# Patient Record
Sex: Male | Born: 1960 | Race: Black or African American | Hispanic: No | Marital: Single | Smoking: Never smoker
Health system: Southern US, Community
[De-identification: ages and names within clinical notes are randomized; demographics above are authoritative.]

## PROBLEM LIST (undated history)

## (undated) DIAGNOSIS — I10 Essential (primary) hypertension: Secondary | ICD-10-CM

## (undated) DIAGNOSIS — I2510 Atherosclerotic heart disease of native coronary artery without angina pectoris: Secondary | ICD-10-CM

---

## 2010-05-14 ENCOUNTER — Emergency Department (HOSPITAL_BASED_OUTPATIENT_CLINIC_OR_DEPARTMENT_OTHER): Admission: EM | Admit: 2010-05-14 | Discharge: 2010-05-14 | Payer: Self-pay | Admitting: Emergency Medicine

## 2016-08-03 ENCOUNTER — Emergency Department (HOSPITAL_COMMUNITY): Payer: Medicaid - Out of State

## 2016-08-03 ENCOUNTER — Encounter (HOSPITAL_COMMUNITY): Payer: Self-pay

## 2016-08-03 ENCOUNTER — Inpatient Hospital Stay (HOSPITAL_COMMUNITY)
Admission: EM | Admit: 2016-08-03 | Discharge: 2016-08-05 | DRG: 638 | Disposition: A | Discharge status: Home / Self Care | Payer: Medicaid - Out of State | Attending: Internal Medicine | Admitting: Internal Medicine

## 2016-08-03 DIAGNOSIS — E873 Alkalosis: Secondary | ICD-10-CM | POA: Diagnosis present

## 2016-08-03 DIAGNOSIS — R739 Hyperglycemia, unspecified: Secondary | ICD-10-CM | POA: Diagnosis not present

## 2016-08-03 DIAGNOSIS — Z888 Allergy status to other drugs, medicaments and biological substances status: Secondary | ICD-10-CM

## 2016-08-03 DIAGNOSIS — E119 Type 2 diabetes mellitus without complications: Secondary | ICD-10-CM

## 2016-08-03 DIAGNOSIS — Z87891 Personal history of nicotine dependence: Secondary | ICD-10-CM

## 2016-08-03 DIAGNOSIS — Z7982 Long term (current) use of aspirin: Secondary | ICD-10-CM

## 2016-08-03 DIAGNOSIS — I2510 Atherosclerotic heart disease of native coronary artery without angina pectoris: Secondary | ICD-10-CM | POA: Diagnosis not present

## 2016-08-03 DIAGNOSIS — Z79899 Other long term (current) drug therapy: Secondary | ICD-10-CM

## 2016-08-03 DIAGNOSIS — F17211 Nicotine dependence, cigarettes, in remission: Secondary | ICD-10-CM

## 2016-08-03 DIAGNOSIS — N179 Acute kidney failure, unspecified: Secondary | ICD-10-CM

## 2016-08-03 DIAGNOSIS — E860 Dehydration: Secondary | ICD-10-CM | POA: Diagnosis present

## 2016-08-03 DIAGNOSIS — Z95810 Presence of automatic (implantable) cardiac defibrillator: Secondary | ICD-10-CM

## 2016-08-03 DIAGNOSIS — E1110 Type 2 diabetes mellitus with ketoacidosis without coma: Principal | ICD-10-CM | POA: Diagnosis present

## 2016-08-03 DIAGNOSIS — I255 Ischemic cardiomyopathy: Secondary | ICD-10-CM | POA: Diagnosis not present

## 2016-08-03 DIAGNOSIS — Z955 Presence of coronary angioplasty implant and graft: Secondary | ICD-10-CM

## 2016-08-03 DIAGNOSIS — Z7902 Long term (current) use of antithrombotics/antiplatelets: Secondary | ICD-10-CM

## 2016-08-03 DIAGNOSIS — I10 Essential (primary) hypertension: Secondary | ICD-10-CM | POA: Diagnosis present

## 2016-08-03 HISTORY — DX: Essential (primary) hypertension: I10

## 2016-08-03 HISTORY — DX: Atherosclerotic heart disease of native coronary artery without angina pectoris: I25.10

## 2016-08-03 LAB — CBC
HCT: 45.6 % (ref 39.0–52.0)
Hemoglobin: 16.6 g/dL (ref 13.0–17.0)
MCH: 30.1 pg (ref 26.0–34.0)
MCHC: 36.4 g/dL — ABNORMAL HIGH (ref 30.0–36.0)
MCV: 82.8 fL (ref 78.0–100.0)
Platelets: 273 10*3/uL (ref 150–400)
RBC: 5.51 MIL/uL (ref 4.22–5.81)
RDW: 13.1 % (ref 11.5–15.5)
WBC: 9.6 10*3/uL (ref 4.0–10.5)

## 2016-08-03 LAB — BASIC METABOLIC PANEL
Anion gap: 17 — ABNORMAL HIGH (ref 5–15)
Anion gap: 11 (ref 5–15)
Anion gap: 11 (ref 5–15)
Anion gap: 8 (ref 5–15)
BUN: 30 mg/dL — ABNORMAL HIGH (ref 6–20)
BUN: 25 mg/dL — ABNORMAL HIGH (ref 6–20)
BUN: 23 mg/dL — AB (ref 6–20)
BUN: 20 mg/dL (ref 6–20)
CHLORIDE: 94 mmol/L — AB (ref 101–111)
CHLORIDE: 96 mmol/L — AB (ref 101–111)
CHLORIDE: 98 mmol/L — AB (ref 101–111)
CO2: 22 mmol/L (ref 22–32)
CO2: 25 mmol/L (ref 22–32)
CO2: 26 mmol/L (ref 22–32)
CO2: 27 mmol/L (ref 22–32)
CREATININE: 1.55 mg/dL — AB (ref 0.61–1.24)
CREATININE: 1.51 mg/dL — AB (ref 0.61–1.24)
CREATININE: 1.35 mg/dL — AB (ref 0.61–1.24)
Calcium: 10.3 mg/dL (ref 8.9–10.3)
Calcium: 9.8 mg/dL (ref 8.9–10.3)
Calcium: 9.7 mg/dL (ref 8.9–10.3)
Calcium: 9.6 mg/dL (ref 8.9–10.3)
Chloride: 82 mmol/L — ABNORMAL LOW (ref 101–111)
Creatinine, Ser: 1.73 mg/dL — ABNORMAL HIGH (ref 0.61–1.24)
GFR calc Af Amer: 49 mL/min — ABNORMAL LOW (ref >60)
GFR calc Af Amer: 56 mL/min — ABNORMAL LOW (ref >60)
GFR calc Af Amer: 58 mL/min — ABNORMAL LOW (ref >60)
GFR calc non Af Amer: 42 mL/min — ABNORMAL LOW (ref >60)
GFR calc non Af Amer: 48 mL/min — ABNORMAL LOW (ref >60)
GFR calc non Af Amer: 50 mL/min — ABNORMAL LOW (ref >60)
GFR calc non Af Amer: 57 mL/min — ABNORMAL LOW (ref >60)
GLUCOSE: 574 mg/dL — AB (ref 65–99)
Glucose, Bld: 1013 mg/dL (ref 65–99)
Glucose, Bld: 477 mg/dL — ABNORMAL HIGH (ref 65–99)
Glucose, Bld: 243 mg/dL — ABNORMAL HIGH (ref 65–99)
Potassium: 5.1 mmol/L (ref 3.5–5.1)
Potassium: 4.8 mmol/L (ref 3.5–5.1)
Potassium: 4.5 mmol/L (ref 3.5–5.1)
Potassium: 3.7 mmol/L (ref 3.5–5.1)
Sodium: 121 mmol/L — ABNORMAL LOW (ref 135–145)
Sodium: 130 mmol/L — ABNORMAL LOW (ref 135–145)
Sodium: 133 mmol/L — ABNORMAL LOW (ref 135–145)
Sodium: 133 mmol/L — ABNORMAL LOW (ref 135–145)

## 2016-08-03 LAB — I-STAT VENOUS BLOOD GAS, ED
Acid-Base Excess: 1 mmol/L (ref 0.0–2.0)
Bicarbonate: 25.9 mmol/L (ref 20.0–28.0)
O2 Saturation: 89 %
PH VEN: 7.424 (ref 7.250–7.430)
TCO2: 27 mmol/L (ref 0–100)
pCO2, Ven: 39.6 mmHg — ABNORMAL LOW (ref 44.0–60.0)
pO2, Ven: 54 mmHg — ABNORMAL HIGH (ref 32.0–45.0)

## 2016-08-03 LAB — URINALYSIS, ROUTINE W REFLEX MICROSCOPIC
Bacteria, UA: NONE SEEN
Bilirubin Urine: NEGATIVE
Glucose, UA: >=500 mg/dL — AB
Hgb urine dipstick: NEGATIVE
Ketones, ur: 20 mg/dL — AB
Leukocytes, UA: NEGATIVE
Nitrite: NEGATIVE
Protein, ur: NEGATIVE mg/dL
Specific Gravity, Urine: 1.025 (ref 1.005–1.030)
Squamous Epithelial / LPF: NONE SEEN
pH: 6 (ref 5.0–8.0)

## 2016-08-03 LAB — GLUCOSE, CAPILLARY
GLUCOSE-CAPILLARY: 499 mg/dL — AB (ref 65–99)
GLUCOSE-CAPILLARY: 420 mg/dL — AB (ref 65–99)
GLUCOSE-CAPILLARY: 262 mg/dL — AB (ref 65–99)
GLUCOSE-CAPILLARY: 156 mg/dL — AB (ref 65–99)
Glucose-Capillary: 501 mg/dL (ref 65–99)
Glucose-Capillary: 348 mg/dL — ABNORMAL HIGH (ref 65–99)
Glucose-Capillary: 304 mg/dL — ABNORMAL HIGH (ref 65–99)

## 2016-08-03 LAB — CBG MONITORING, ED: GLUCOSE-CAPILLARY: 514 mg/dL — AB (ref 65–99)

## 2016-08-03 LAB — MRSA PCR SCREENING: MRSA by PCR: NEGATIVE

## 2016-08-03 LAB — MAGNESIUM
Magnesium: 2.2 mg/dL (ref 1.7–2.4)
Magnesium: 2.2 mg/dL (ref 1.7–2.4)

## 2016-08-03 LAB — TROPONIN I
TROPONIN I: 0.04 ng/mL — AB (ref <0.03)
Troponin I: 0.04 ng/mL (ref <0.03)
Troponin I: 0.03 ng/mL (ref <0.03)

## 2016-08-03 MED ORDER — DEXTROSE-NACL 5-0.45 % IV SOLN: INTRAVENOUS | Status: DC

## 2016-08-03 MED ORDER — SODIUM CHLORIDE 0.9 % IV BOLUS (SEPSIS)
1000.0000 mL | Freq: Once | INTRAVENOUS | Status: AC
  Administered 2016-08-03: 1000 mL via INTRAVENOUS

## 2016-08-03 MED ORDER — ENOXAPARIN SODIUM 40 MG/0.4ML ~~LOC~~ SOLN
40.0000 mg | SUBCUTANEOUS | Status: DC
  Administered 2016-08-03 – 2016-08-04 (×2): 40 mg via SUBCUTANEOUS
  Filled 2016-08-03 (×3): qty 0.4

## 2016-08-03 MED ORDER — ASPIRIN EC 81 MG PO TBEC
81.0000 mg | DELAYED_RELEASE_TABLET | Freq: Every day | ORAL | Status: DC
  Administered 2016-08-03 – 2016-08-05 (×3): 81 mg via ORAL
  Filled 2016-08-03 (×3): qty 1

## 2016-08-03 MED ORDER — SODIUM CHLORIDE 0.9 % IV SOLN
INTRAVENOUS | Status: DC
  Administered 2016-08-03: 14:00:00 via INTRAVENOUS

## 2016-08-03 MED ORDER — SODIUM CHLORIDE 0.9% FLUSH
3.0000 mL | Freq: Two times a day (BID) | INTRAVENOUS | Status: DC
  Administered 2016-08-04 – 2016-08-05 (×2): 3 mL via INTRAVENOUS

## 2016-08-03 MED ORDER — INSULIN GLARGINE 100 UNIT/ML ~~LOC~~ SOLN
20.0000 [IU] | Freq: Every day | SUBCUTANEOUS | Status: DC
  Administered 2016-08-03 – 2016-08-04 (×2): 20 [IU] via SUBCUTANEOUS
  Filled 2016-08-03 (×2): qty 0.2

## 2016-08-03 MED ORDER — INSULIN ASPART 100 UNIT/ML ~~LOC~~ SOLN
11.0000 [IU] | Freq: Once | SUBCUTANEOUS | Status: AC
  Administered 2016-08-03: 11 [IU] via INTRAVENOUS
  Filled 2016-08-03: qty 1

## 2016-08-03 MED ORDER — ROSUVASTATIN CALCIUM 40 MG PO TABS
40.0000 mg | ORAL_TABLET | Freq: Every day | ORAL | Status: DC
  Administered 2016-08-03 – 2016-08-05 (×3): 40 mg via ORAL
  Filled 2016-08-03 (×3): qty 1

## 2016-08-03 MED ORDER — ADULT MULTIVITAMIN W/MINERALS CH
1.0000 | ORAL_TABLET | Freq: Every day | ORAL | Status: DC
  Administered 2016-08-03 – 2016-08-05 (×3): 1 via ORAL
  Filled 2016-08-03 (×3): qty 1

## 2016-08-03 MED ORDER — TICAGRELOR 90 MG PO TABS
90.0000 mg | ORAL_TABLET | Freq: Two times a day (BID) | ORAL | Status: DC
  Administered 2016-08-03 – 2016-08-05 (×4): 90 mg via ORAL
  Filled 2016-08-03 (×4): qty 1

## 2016-08-03 MED ORDER — CARVEDILOL 6.25 MG PO TABS
6.2500 mg | ORAL_TABLET | Freq: Every day | ORAL | Status: DC
  Administered 2016-08-03 – 2016-08-05 (×3): 6.25 mg via ORAL
  Filled 2016-08-03 (×3): qty 1

## 2016-08-03 MED ORDER — SODIUM CHLORIDE 0.9 % IV BOLUS (SEPSIS): 1000.0000 mL | Freq: Once | INTRAVENOUS | Status: DC

## 2016-08-03 MED ORDER — INSULIN REGULAR HUMAN 100 UNIT/ML IJ SOLN
INTRAMUSCULAR | Status: DC
  Administered 2016-08-03: 4.4 [IU]/h via INTRAVENOUS
  Filled 2016-08-03 (×2): qty 2.5

## 2016-08-03 NOTE — ED Notes (Signed)
Family at bedside. 

## 2016-08-03 NOTE — ED Notes (Signed)
Patient transported to X-ray 

## 2016-08-03 NOTE — ED Notes (Addendum)
Pt returns from radiology. Placed back on tele. 

## 2016-08-03 NOTE — ED Notes (Signed)
Attempted to call report

## 2016-08-03 NOTE — ED Provider Notes (Addendum)
[facility]-EMERGENCY DEPT Provider Note   CSN: [id number] Arrival date & time: 08/03/16  4098     History   Chief Complaint Chief Complaint  Patient presents with  . Weakness/hyperglycemia    HPI Paul Avila is a 55 y.o. male.  HPI 43 old male with history of hypertension and coronary disease here with a several week history of progressively worsening polyuria, polydipsia, and general fatigue. The patient states that over the last 2 weeks, he has noticed progressively worsening thirst and seems to be urinating more frequently. He feels that he is emptying completely but has to go more often. He is waking up multiple times throughout the night. He also endorses generalized weight loss, fatigue, and decreased appetite. He thought he had an infection so decided to present to the ED. Denies any fevers. Denies any cough. No shortness of breath. He has no history of diabetes and has never taken medications for this. He has not recently been evaluated by a physician.  Past Medical History:  Diagnosis Date  . Coronary artery disease   . Hypertension     Patient Active Problem List   Diagnosis Date Noted  . Hyperglycemia 08/03/2016  . CAD (coronary artery disease) 08/03/2016  . HTN (hypertension) 08/03/2016    History reviewed. No pertinent surgical history.     Home Medications    Prior to Admission medications   Medication Sig Start Date End Date Taking? Authorizing Provider  aspirin EC 81 MG tablet Take 81 mg by mouth daily.   Yes Historical Provider, MD  carvedilol (COREG) 6.25 MG tablet Take 6.25 mg by mouth daily at 12 noon.   Yes Historical Provider, MD  furosemide (LASIX) 40 MG tablet Take 40 mg by mouth daily.   Yes Historical Provider, MD  losartan (COZAAR) 25 MG tablet Take 25 mg by mouth daily at 12 noon.   Yes Historical Provider, MD  Multiple Vitamin (MULTIVITAMIN WITH MINERALS) TABS tablet Take 1 tablet by mouth daily.   Yes Historical Provider, MD  rosuvastatin  (CRESTOR) 40 MG tablet Take 40 mg by mouth daily at 12 noon.   Yes Historical Provider, MD  ticagrelor (BRILINTA) 90 MG TABS tablet Take 90 mg by mouth 2 (two) times daily.   Yes Historical Provider, MD    Family History No family history on file.  Social History Social History  Substance Use Topics  . Smoking status: Never Smoker  . Smokeless tobacco: Never Used  . Alcohol use Not on file     Allergies   Glucophage [metformin] and Januvia [sitagliptin]   Review of Systems Review of Systems  Constitutional: Positive for fatigue and unexpected weight change. Negative for chills and fever.  HENT: Negative for congestion and rhinorrhea.   Eyes: Negative for visual disturbance.  Respiratory: Negative for cough, shortness of breath and wheezing.   Cardiovascular: Negative for chest pain and leg swelling.  Gastrointestinal: Negative for abdominal pain, diarrhea, nausea and vomiting.  Endocrine: Positive for polydipsia, polyphagia and polyuria.  Genitourinary: Negative for dysuria and flank pain.  Musculoskeletal: Negative for neck pain and neck stiffness.  Skin: Negative for rash and wound.  Allergic/Immunologic: Negative for immunocompromised state.  Neurological: Negative for syncope, weakness and headaches.  All other systems reviewed and are negative.    Physical Exam Updated Vital Signs BP 116/94   Pulse 80   Temp 98 F (36.7 C) (Oral)   Resp 18   Ht 5\' 9"  (1.753 m)   Wt 245 lb (111.1 kg)  SpO2 99%   BMI 36.18 kg/m   Physical Exam  Constitutional: He appears well-developed and well-nourished. No distress.  HENT:  Head: Normocephalic.  Dry mucous membranes  Eyes: Conjunctivae are normal. Pupils are equal, round, and reactive to light.  Neck: Neck supple.  Cardiovascular: Normal rate, regular rhythm and normal heart sounds.   No murmur heard. Pulmonary/Chest: Effort normal. No respiratory distress. He has no wheezes. He has no rales.  Abdominal: Soft. Bowel  sounds are normal. He exhibits no distension. There is no tenderness.  Musculoskeletal: He exhibits no edema.  Neurological: He is alert.  Skin: Skin is warm. Capillary refill takes 2 to 3 seconds.  Nursing note and vitals reviewed.    ED Treatments / Results  Labs (all labs ordered are listed, but only abnormal results are displayed) Labs Reviewed  BASIC METABOLIC PANEL - Abnormal; Notable for the following:       Result Value   Sodium 121 (*)    Chloride 82 (*)    Glucose, Bld 1,013 (*)    BUN 30 (*)    Creatinine, Ser 1.73 (*)    GFR calc non Af Amer 42 (*)    GFR calc Af Amer 49 (*)    Anion gap 17 (*)    All other components within normal limits  CBC - Abnormal; Notable for the following:    MCHC 36.4 (*)    All other components within normal limits  URINALYSIS, ROUTINE W REFLEX MICROSCOPIC - Abnormal; Notable for the following:    Color, Urine STRAW (*)    Glucose, UA >=500 (*)    Ketones, ur 20 (*)    All other components within normal limits  TROPONIN I - Abnormal; Notable for the following:    Troponin I 0.04 (*)    All other components within normal limits  BASIC METABOLIC PANEL - Abnormal; Notable for the following:    Sodium 130 (*)    Chloride 94 (*)    Glucose, Bld 574 (*)    BUN 25 (*)    Creatinine, Ser 1.55 (*)    GFR calc non Af Amer 48 (*)    GFR calc Af Amer 56 (*)    All other components within normal limits  TROPONIN I - Abnormal; Notable for the following:    Troponin I 0.04 (*)    All other components within normal limits  CBG MONITORING, ED - Abnormal; Notable for the following:    Glucose-Capillary >600 (*)    All other components within normal limits  I-STAT VENOUS BLOOD GAS, ED - Abnormal; Notable for the following:    pCO2, Ven 39.6 (*)    pO2, Ven 54.0 (*)    All other components within normal limits  CBG MONITORING, ED - Abnormal; Notable for the following:    Glucose-Capillary 514 (*)    All other components within normal limits    MAGNESIUM  BASIC METABOLIC PANEL  BASIC METABOLIC PANEL  BASIC METABOLIC PANEL  TROPONIN I  TROPONIN I    EKG  EKG Interpretation  Date/Time:  Saturday August 03 2016 10:22:59 EST Ventricular Rate:  90 PR Interval:    QRS Duration: 91 QT Interval:  375 QTC Calculation: 459 R Axis:   66 Text Interpretation:  Sinus rhythm Probable left atrial enlargement Anterior infarct, old Baseline wander in lead(s) V5 No old tracing to compare No ST elevations or depressions Confirmed by Erma HeritageISAACS MD, Laurabeth Yip [id number]) on 08/03/2016 11:08:30 AM  Radiology Dg Chest 2 View  Result Date: 08/03/2016 CLINICAL DATA:  Cough. EXAM: CHEST  2 VIEW COMPARISON:  None. 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 No pneumothorax. The heart, hila, mediastinum, lungs, and pleura are normal. Degenerative changes in the thoracic spine. No other abnormalities. IMPRESSION: No active cardiopulmonary disease. Electronically Signed   By: Gerome Samavid  Williams III M.D   On: 08/03/2016 10:54    Procedures Procedures (including critical care time)  Medications Ordered in ED Medications  0.9 %  sodium chloride infusion (not administered)  dextrose 5 %-0.45 % sodium chloride infusion (not administered)  insulin regular (NOVOLIN R,HUMULIN R) 250 Units in sodium chloride 0.9 % 250 mL (1 Units/mL) infusion (not administered)  sodium chloride 0.9 % bolus 1,000 mL (not administered)  sodium chloride 0.9 % bolus 1,000 mL (0 mLs Intravenous Stopped 08/03/16 1202)  sodium chloride 0.9 % bolus 1,000 mL (0 mLs Intravenous Stopped 08/03/16 1313)  insulin aspart (novoLOG) injection 11 Units (11 Units Intravenous Given 08/03/16 1157)     Initial Impression / Assessment and Plan / ED Course  I have reviewed the triage vital signs and the nursing notes.  Pertinent labs & imaging results that were available during my care of the patient were reviewed by me and considered in my medical decision making (see chart for  details).  Clinical Course     [age] old male who presents with polyuria, polydipsia, and polyphagia. On arrival, vital signs are stable. He is markedly dehydrated on exam. Labwork shows glucose of 1013 with AK I. Anion gap is only 17 and her only 20 ketones in his urine with normal pH and bicarbonate. I suspect his mild elevation of anion gap is due to dehydration and he shows no evidence of DKA. Will give IV fluids, IV insulin, and admit to medicine for new onset, symptomatic, severe type 2 diabetes with hyperglycemia.  Final Clinical Impressions(s) / ED Diagnoses   Final diagnoses:  Hyperglycemia  New onset type 2 diabetes mellitus [facility](HCC)    New Prescriptions Current Discharge Medication List       Shaune Pollackameron Collyn Selk, MD 08/03/16 1537    Shaune Pollackameron Kevis Qu, MD 08/14/16 2050

## 2016-08-03 NOTE — H&P (Signed)
Date: 08/03/2016               Patient Name:  Paul Avila MRN: [id number]  DOB: 06-Mar-1961 Age / Sex: 55 y.o., male   PCP: No primary care provider on file.         Medical Service: Internal Medicine Teaching Service         Attending Physician: Dr. Earl Lagos    First Contact: Dr. Carolynn Comment Pager: [phone]  Second Contact: Dr. Reggie Pile Pager: [phone]       After Hours (After 5p/  First Contact Pager: [phone]  weekends / holidays): Second Contact Pager: [phone]   Chief Complaint: Polyuria, polydipsia, fatigue  History of Present Illness: Mr. Paul Avila is a 55 y.o. male with a h/o of CAD s/p AICD and HTN who presents with several week h/o progressive fatigue, general malaise, polyuria, and polydipsia.  Pt reports that he began to feel unwell 3 weeks ago w/ worsening of fatigue, thirst, and increased frequency of urination. He has not h/o DM. He thought that he might have an infection and presented to the ED for evaluation. He reports that he has been drinking everything he can get his hands on including lots of soda and juice at home. He also notes that he has had difficulty sleeping 2/2 frequent waking for urination. He reports no trouble with voiding completely, but reports increased frequency. He also complains of very dry mouth. He endorses some blurring of vision when his symptoms began.  He denies any fever, chills, SOB, CP, myalgias, HA, or changes to mental status.  In the ED he was found to have BG >1000 w/o evidence of acidosis or changes to MS. He was given 2L NS bolus and 11U Aspart Insulin.  Meds: Current Facility-Administered Medications  Medication Dose Route Frequency Provider Last Rate Last Dose  . 0.9 %  sodium chloride infusion   Intravenous Continuous John Giovanni, MD      . dextrose 5 %-0.45 % sodium chloride infusion   Intravenous Continuous John Giovanni, MD      . insulin regular (NOVOLIN R,HUMULIN R) 250 Units in sodium  chloride 0.9 % 250 mL (1 Units/mL) infusion   Intravenous Continuous John Giovanni, MD      . sodium chloride 0.9 % bolus 1,000 mL  1,000 mL Intravenous Once John Giovanni, MD       Current Outpatient Prescriptions  Medication Sig Dispense Refill  . aspirin EC 81 MG tablet Take 81 mg by mouth daily.    . carvedilol (COREG) 6.25 MG tablet Take 6.25 mg by mouth daily at 12 noon.    . furosemide (LASIX) 40 MG tablet Take 40 mg by mouth daily.    Marland Kitchen losartan (COZAAR) 25 MG tablet Take 25 mg by mouth daily at 12 noon.    . Multiple Vitamin (MULTIVITAMIN WITH MINERALS) TABS tablet Take 1 tablet by mouth daily.    . rosuvastatin (CRESTOR) 40 MG tablet Take 40 mg by mouth daily at 12 noon.    . ticagrelor (BRILINTA) 90 MG TABS tablet Take 90 mg by mouth 2 (two) times daily.      (Not in a hospital admission) Allergies: Allergies as of 08/03/2016 - Review Complete 08/03/2016  Allergen Reaction Noted  . Glucophage [metformin] Hives 08/03/2016  . Januvia [sitagliptin]  08/03/2016   Past Medical History:  Diagnosis Date  . Coronary artery disease   . Hypertension    Family History: Pt denies family  hx of DM.  Social History: Pt has long smoking hx, w/ 2 ppd for many years, reportedly quit in Sept. Reports drinking liquor every other week. Denies recreational drugs. Lives in [place], in [place].  Review of Systems: A complete ROS was negative except as per HPI. Review of Systems  Constitutional: Positive for malaise/fatigue. Negative for chills, diaphoresis, fever and weight loss.  Eyes: Positive for blurred vision. Negative for double vision.  Respiratory: Negative for cough and shortness of breath.   Cardiovascular: Negative for chest pain and leg swelling.  Gastrointestinal: Negative for abdominal pain, constipation, diarrhea, nausea and vomiting.  Genitourinary: Positive for frequency. Negative for dysuria, hematuria and urgency.  Musculoskeletal: Negative for myalgias.    Skin: Negative for rash.  Neurological: Negative for dizziness, tremors, focal weakness, weakness and headaches.  Endo/Heme/Allergies: Positive for polydipsia.  Psychiatric/Behavioral: The patient is not nervous/anxious.    Physical Exam: Vitals:   08/03/16 1145 08/03/16 1215 08/03/16 1245 08/03/16 1300  BP: 125/92 130/82 131/89 122/87  Pulse: 84 83 95 80  Resp: 22 18 17 25   Temp:      TempSrc:      SpO2: 99% 99% 100% 100%  Weight:      Height:       Physical Exam  Constitutional: He is oriented to person, place, and time. He appears well-developed and well-nourished. He is cooperative. No distress.  HENT:  Head: Normocephalic and atraumatic.  Right Ear: Hearing normal.  Left Ear: Hearing normal.  Nose: Nose normal.  Mouth/Throat: Mucous membranes are dry.  Cardiovascular: Normal rate, regular rhythm, S1 normal, S2 normal and intact distal pulses.  Exam reveals no gallop.   No murmur heard. Pulmonary/Chest: Effort normal and breath sounds normal. No respiratory distress. He has no wheezes. He has no rhonchi. He has no rales. He exhibits no tenderness.  Abdominal: Soft. Normal appearance and bowel sounds are normal. He exhibits no ascites. There is no hepatosplenomegaly. There is no tenderness.  Musculoskeletal: He exhibits no edema or tenderness.  Neurological: He is alert and oriented to person, place, and time. He has normal strength.  Skin: Skin is warm, dry and intact. He is not diaphoretic.  Psychiatric: He has a normal mood and affect. His speech is normal and behavior is normal.   Labs: CBC:  Recent Labs Lab 08/03/16 1023  WBC 9.6  HGB 16.6  HCT 45.6  MCV 82.8  PLT 273   Basic Metabolic Panel:  Recent Labs Lab 08/03/16 1023  NA 121*  K 5.1  CL 82*  CO2 22  GLUCOSE 1,013*  BUN 30*  CREATININE 1.73*  CALCIUM 10.3    Recent Labs Lab 08/03/16 1003  GLUCAP >600*   Urinalysis    Component Value Date/Time   COLORURINE STRAW (A) 08/03/2016 1130    APPEARANCEUR CLEAR 08/03/2016 1130   LABSPEC 1.025 08/03/2016 1130   PHURINE 6.0 08/03/2016 1130   GLUCOSEU >=500 (A) 08/03/2016 1130   HGBUR NEGATIVE 08/03/2016 1130   BILIRUBINUR NEGATIVE 08/03/2016 1130   KETONESUR 20 (A) 08/03/2016 1130   PROTEINUR NEGATIVE 08/03/2016 1130   NITRITE NEGATIVE 08/03/2016 1130   LEUKOCYTESUR NEGATIVE 08/03/2016 1130   EKG: EKG Interpretation  Date/Time:  Saturday August 03 2016 10:22:59 EST Ventricular Rate:  90 PR Interval:    QRS Duration: 91 QT Interval:  375 QTC Calculation: 459 R Axis:   66 Text Interpretation:  Sinus rhythm Probable left atrial enlargement Anterior infarct, old Baseline wander in lead(s) V5 No old tracing  to compare No ST elevations or depressions Confirmed by Erma HeritageISAACS MD, CAMERON [phone]([id number]) on 08/03/2016 11:08:30 AM  Imaging: Dg Chest 2 View  Result Date: 08/03/2016 CLINICAL DATA:  Cough. EXAM: CHEST  2 VIEW COMPARISON:  None. 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 No pneumothorax. The heart, hila, mediastinum, lungs, and pleura are normal. Degenerative changes in the thoracic spine. No other abnormalities. IMPRESSION: No active cardiopulmonary disease. Electronically Signed   By: Gerome Samavid  Williams III M.D   On: 08/03/2016 10:54   Assessment & Plan by Problem: Principal Problem:   Hyperglycemia Active Problems:   CAD (coronary artery disease)   HTN (hypertension)  Mr. Paul Avila is a 55 y.o. male with h/o CAD and HTN who presents w/ polyuria, polydipsia found to be 2/2 hyperglycemia.  1) Hyperglycemia: Reports no h/o DM, but records demonstrate h/o metformin and januvia (now listed as allergies). BG >1000 w/ h/o polyuria/polydipsia. No changes to MS, non-acidotic, mild ketonuria, AG 17. Unlikely DKA, not HHS d/t stable MS. - admit to stepdown - insulin ggt, q2h CBG, then SQ Aspart when BG <250 - 1L NS bolus, then 150cc/hr - repeat BMP q4h - check Mg / K and replete PRN - A1c  2) CAD / ischemic  cardiomyopathy: denies CP. AICD visualized on CXR. Non-ischemic EKG. Mild trop bump in ED. On furosemide, losartan, carvedilol, rosuvastatin, ticagrelor at home. Continue home meds except hold lasix and ARB for AKI. - trend Trop  3) HTN: BP stable on presentation. Continue home meds as above.  4) AKI: Scr 1.7 on presentation, records indicate baseline of 1.5. Likely prerenal 2/2 dehydration. Hold lasix and ARB. IVF. Recheck SCr.  DVT PPx - low molecular weight heparin  Code Status - FULL  Dispo: Admit patient for Observation.  Signed: Carolynn CommentBryan Favor Hackler, MD 08/03/2016, 2:15 PM  Pager: [phone]

## 2016-08-03 NOTE — ED Triage Notes (Signed)
Patient complains of 2 weeks of fatigue and weakness. Complains of increased thirst and frequent urination. States no history of diabetes. Lips dry and cracked on arrival. CBG reading critical high a triage. Alert and oriented

## 2016-08-04 DIAGNOSIS — N179 Acute kidney failure, unspecified: Secondary | ICD-10-CM | POA: Diagnosis present

## 2016-08-04 DIAGNOSIS — R739 Hyperglycemia, unspecified: Secondary | ICD-10-CM | POA: Diagnosis not present

## 2016-08-04 DIAGNOSIS — Z79899 Other long term (current) drug therapy: Secondary | ICD-10-CM | POA: Diagnosis not present

## 2016-08-04 DIAGNOSIS — E1165 Type 2 diabetes mellitus with hyperglycemia: Secondary | ICD-10-CM | POA: Diagnosis present

## 2016-08-04 DIAGNOSIS — I10 Essential (primary) hypertension: Secondary | ICD-10-CM | POA: Diagnosis present

## 2016-08-04 DIAGNOSIS — E1110 Type 2 diabetes mellitus with ketoacidosis without coma: Secondary | ICD-10-CM | POA: Diagnosis present

## 2016-08-04 DIAGNOSIS — Z7982 Long term (current) use of aspirin: Secondary | ICD-10-CM | POA: Diagnosis not present

## 2016-08-04 DIAGNOSIS — Z955 Presence of coronary angioplasty implant and graft: Secondary | ICD-10-CM | POA: Diagnosis not present

## 2016-08-04 DIAGNOSIS — E860 Dehydration: Secondary | ICD-10-CM | POA: Diagnosis present

## 2016-08-04 DIAGNOSIS — Z87891 Personal history of nicotine dependence: Secondary | ICD-10-CM | POA: Diagnosis not present

## 2016-08-04 DIAGNOSIS — Z7902 Long term (current) use of antithrombotics/antiplatelets: Secondary | ICD-10-CM | POA: Diagnosis not present

## 2016-08-04 DIAGNOSIS — I255 Ischemic cardiomyopathy: Secondary | ICD-10-CM | POA: Diagnosis present

## 2016-08-04 DIAGNOSIS — Z95810 Presence of automatic (implantable) cardiac defibrillator: Secondary | ICD-10-CM | POA: Diagnosis not present

## 2016-08-04 DIAGNOSIS — E873 Alkalosis: Secondary | ICD-10-CM | POA: Diagnosis present

## 2016-08-04 DIAGNOSIS — Z888 Allergy status to other drugs, medicaments and biological substances status: Secondary | ICD-10-CM | POA: Diagnosis not present

## 2016-08-04 DIAGNOSIS — I2510 Atherosclerotic heart disease of native coronary artery without angina pectoris: Secondary | ICD-10-CM | POA: Diagnosis present

## 2016-08-04 LAB — GLUCOSE, CAPILLARY
GLUCOSE-CAPILLARY: 134 mg/dL — AB (ref 65–99)
GLUCOSE-CAPILLARY: 351 mg/dL — AB (ref 65–99)
GLUCOSE-CAPILLARY: 409 mg/dL — AB (ref 65–99)
GLUCOSE-CAPILLARY: 411 mg/dL — AB (ref 65–99)
GLUCOSE-CAPILLARY: 476 mg/dL — AB (ref 65–99)
Glucose-Capillary: 147 mg/dL — ABNORMAL HIGH (ref 65–99)

## 2016-08-04 LAB — BASIC METABOLIC PANEL
ANION GAP: 10 (ref 5–15)
Anion gap: 8 (ref 5–15)
BUN: 19 mg/dL (ref 6–20)
BUN: 19 mg/dL (ref 6–20)
CALCIUM: 9.4 mg/dL (ref 8.9–10.3)
CHLORIDE: 98 mmol/L — AB (ref 101–111)
CO2: 29 mmol/L (ref 22–32)
CO2: 25 mmol/L (ref 22–32)
CREATININE: 1.46 mg/dL — AB (ref 0.61–1.24)
Calcium: 9.2 mg/dL (ref 8.9–10.3)
Chloride: 98 mmol/L — ABNORMAL LOW (ref 101–111)
Creatinine, Ser: 1.4 mg/dL — ABNORMAL HIGH (ref 0.61–1.24)
GFR calc Af Amer: >60 mL/min (ref >60)
GFR calc non Af Amer: 52 mL/min — ABNORMAL LOW (ref >60)
GFR calc non Af Amer: 55 mL/min — ABNORMAL LOW (ref >60)
GLUCOSE: 265 mg/dL — AB (ref 65–99)
Glucose, Bld: 355 mg/dL — ABNORMAL HIGH (ref 65–99)
POTASSIUM: 4 mmol/L (ref 3.5–5.1)
Potassium: 4 mmol/L (ref 3.5–5.1)
SODIUM: 133 mmol/L — AB (ref 135–145)
Sodium: 135 mmol/L (ref 135–145)

## 2016-08-04 LAB — TROPONIN I: Troponin I: 0.03 ng/mL (ref <0.03)

## 2016-08-04 LAB — HEMOGLOBIN A1C: Mean Plasma Glucose: >398 mg/dL

## 2016-08-04 MED ORDER — INSULIN GLARGINE 100 UNIT/ML ~~LOC~~ SOLN: 25.0000 [IU] | Freq: Every day | SUBCUTANEOUS | Status: DC

## 2016-08-04 MED ORDER — INSULIN GLARGINE 100 UNIT/ML ~~LOC~~ SOLN: 30.0000 [IU] | Freq: Every day | SUBCUTANEOUS | Status: DC

## 2016-08-04 MED ORDER — INSULIN GLARGINE 100 UNIT/ML ~~LOC~~ SOLN: 25.0000 [IU] | Freq: Once | SUBCUTANEOUS | Status: DC

## 2016-08-04 MED ORDER — INSULIN ASPART 100 UNIT/ML ~~LOC~~ SOLN
0.0000 [IU] | Freq: Three times a day (TID) | SUBCUTANEOUS | Status: DC
  Administered 2016-08-05: 15 [IU] via SUBCUTANEOUS

## 2016-08-04 MED ORDER — GLIPIZIDE 5 MG PO TABS: 5.0000 mg | ORAL_TABLET | Freq: Every day | ORAL | 3 refills | Status: DC

## 2016-08-04 MED ORDER — INSULIN GLARGINE 100 UNIT/ML ~~LOC~~ SOLN
30.0000 [IU] | Freq: Every day | SUBCUTANEOUS | Status: DC
  Administered 2016-08-05: 30 [IU] via SUBCUTANEOUS
  Filled 2016-08-04: qty 0.3

## 2016-08-04 MED ORDER — INSULIN ASPART 100 UNIT/ML ~~LOC~~ SOLN: 0.0000 [IU] | Freq: Every day | SUBCUTANEOUS | Status: DC

## 2016-08-04 MED ORDER — INSULIN ASPART 100 UNIT/ML ~~LOC~~ SOLN
0.0000 [IU] | Freq: Three times a day (TID) | SUBCUTANEOUS | Status: DC
  Administered 2016-08-04 (×2): 10 [IU] via SUBCUTANEOUS

## 2016-08-04 MED ORDER — INSULIN ASPART 100 UNIT/ML ~~LOC~~ SOLN
0.0000 [IU] | Freq: Every day | SUBCUTANEOUS | Status: DC
  Administered 2016-08-04: 5 [IU] via SUBCUTANEOUS

## 2016-08-04 MED ORDER — INSULIN ASPART 100 UNIT/ML ~~LOC~~ SOLN
0.0000 [IU] | Freq: Three times a day (TID) | SUBCUTANEOUS | Status: DC
  Administered 2016-08-04: 15 [IU] via SUBCUTANEOUS

## 2016-08-04 MED ORDER — INSULIN ASPART 100 UNIT/ML ~~LOC~~ SOLN: 0.0000 [IU] | SUBCUTANEOUS | Status: DC

## 2016-08-04 NOTE — Progress Notes (Signed)
Patients morning blood sugar was 409, per order MD notified and order received to administer 10 units of novolog. Additionally, patient initially un-accepting of the diagnosis of diabetes and use of insulin at home but upon further communication and education patient has now accepted the diagnosis and is willing to begin insulin upon discharge. Dr. Ladona Ridgelaylor notified

## 2016-08-04 NOTE — Progress Notes (Signed)
   Subjective: No acute events overnight. Patient feels better this morning.  Objective:  Vital signs in last 24 hours: Vitals:   08/03/16 2351 08/04/16 0318 08/04/16 0802 08/04/16 1137  BP: 103/80 96/63 (!) 125/92 124/88  Pulse: 83 70 76 96  Resp: (!) 25 12 13  (!) 22  Temp: 98 F (36.7 C) 98.3 F (36.8 C) 98.9 F (37.2 C) 98.3 F (36.8 C)  TempSrc: Oral Oral Oral Oral  SpO2: 100% 100% 100% 100%  Weight:      Height:       Physical Exam  Constitutional: He is oriented to person, place, and time. He appears well-developed and well-nourished.  HENT:  Head: Normocephalic and atraumatic.  Cardiovascular: Normal rate and regular rhythm.  Exam reveals no friction rub.   No murmur heard. Respiratory: Effort normal and breath sounds normal. No respiratory distress. He has no wheezes.  GI: Soft. Bowel sounds are normal. He exhibits no distension. There is no tenderness.  Musculoskeletal: He exhibits no edema.  Neurological: He is alert and oriented to person, place, and time.     Assessment/Plan:  Principal Problem:   Hyperglycemia Active Problems:   CAD (coronary artery disease)   HTN (hypertension)  1) Hyperglycemia: Reports no h/o DM, but records demonstrate h/o metformin and januvia (now listed as allergies). BG >1000 w/ h/o polyuria/polydipsia. No changes to MS, non-acidotic, mild ketonuria, AG 17. Unlikely DKA, not HHS d/t stable MS. Patient's glucose improved overnight and the insulin drip was discontinued. Discussing with the patient this morning he adamantly denies that he has diabetes. He refuses to take insulin. He does not want start any new medications but has agreed to start one oral medication for his sugar. Again, the patient stated that he does not have diabetes despite what we told him and how high his blood sugars were. He refuses to take insulin. -- We'll start glipizide 5 mg once daily. He will need to make an appointment with community health and wellness while  in [place] and f/u with his pcp when he returns to [place]  2) CAD / ischemic cardiomyopathy: denies CP. AICD visualized on CXR. Non-ischemic EKG. Mild trop bump in ED. On furosemide, losartan, carvedilol, rosuvastatin, ticagrelor at home. Continue home meds except hold lasix and ARB for AKI. No chest pain on rounds this morning. Says he feels great. He is ready to leave and says he is leaving today. - Troponin peaked at 0.04  3) HTN: BP stable on presentation. Continue home meds as above.  4) AKI: Scr 1.7 on presentation, records indicate baseline of 1.5. Likely prerenal 2/2 dehydration. Hold lasix and ARB. IVF. Recheck SCr. Most recently 1.40. Will d/c patient on all his home medications as his Cr is improved.  DVT PPx - low molecular weight heparin  Code Status - FULL  Dispo: Anticipated discharge today.  Thomasene LotJames Hildegarde Dunaway, MD 08/04/2016, 12:44 PM Pager: [phone]

## 2016-08-04 NOTE — Progress Notes (Signed)
Patients noon blood sugar is again elevated above sliding scale range for nurse treatment at 476, Dr. Ladona Ridgelaylor notified and order received to administer 10 units of novolog again. Medications administered and will continue to monitor

## 2016-08-05 DIAGNOSIS — E119 Type 2 diabetes mellitus without complications: Secondary | ICD-10-CM

## 2016-08-05 DIAGNOSIS — Z95810 Presence of automatic (implantable) cardiac defibrillator: Secondary | ICD-10-CM

## 2016-08-05 DIAGNOSIS — I2510 Atherosclerotic heart disease of native coronary artery without angina pectoris: Secondary | ICD-10-CM

## 2016-08-05 DIAGNOSIS — I10 Essential (primary) hypertension: Secondary | ICD-10-CM

## 2016-08-05 DIAGNOSIS — E1110 Type 2 diabetes mellitus with ketoacidosis without coma: Principal | ICD-10-CM

## 2016-08-05 LAB — GLUCOSE, CAPILLARY
GLUCOSE-CAPILLARY: 413 mg/dL — AB (ref 65–99)
Glucose-Capillary: 400 mg/dL — ABNORMAL HIGH (ref 65–99)
Glucose-Capillary: 295 mg/dL — ABNORMAL HIGH (ref 65–99)

## 2016-08-05 LAB — BASIC METABOLIC PANEL
ANION GAP: 10 (ref 5–15)
BUN: 16 mg/dL (ref 6–20)
CHLORIDE: 98 mmol/L — AB (ref 101–111)
CO2: 25 mmol/L (ref 22–32)
CREATININE: 1.35 mg/dL — AB (ref 0.61–1.24)
Calcium: 9.2 mg/dL (ref 8.9–10.3)
GFR calc non Af Amer: 57 mL/min — ABNORMAL LOW (ref >60)
Glucose, Bld: 419 mg/dL — ABNORMAL HIGH (ref 65–99)
POTASSIUM: 3.7 mmol/L (ref 3.5–5.1)
SODIUM: 133 mmol/L — AB (ref 135–145)

## 2016-08-05 MED ORDER — POLYETHYLENE GLYCOL 3350 17 G PO PACK
17.0000 g | PACK | Freq: Every day | ORAL | Status: DC
  Administered 2016-08-05: 17 g via ORAL
  Filled 2016-08-05: qty 1

## 2016-08-05 MED ORDER — "SYRINGE/NEEDLE (DISP) 30G X 1/2"" 1 ML MISC": 3 refills | Status: DC

## 2016-08-05 MED ORDER — INSULIN GLARGINE 100 UNIT/ML ~~LOC~~ SOLN: 30.0000 [IU] | Freq: Every day | SUBCUTANEOUS | 11 refills | Status: DC

## 2016-08-05 MED ORDER — GLIPIZIDE 5 MG PO TABS
5.0000 mg | ORAL_TABLET | Freq: Two times a day (BID) | ORAL | Status: DC
  Administered 2016-08-05: 5 mg via ORAL
  Filled 2016-08-05: qty 1

## 2016-08-05 MED ORDER — INSULIN ASPART 100 UNIT/ML ~~LOC~~ SOLN
10.0000 [IU] | Freq: Three times a day (TID) | SUBCUTANEOUS | Status: DC
  Administered 2016-08-05: 10 [IU] via SUBCUTANEOUS

## 2016-08-05 MED ORDER — LIVING WELL WITH DIABETES BOOK
Freq: Once | Status: AC
  Administered 2016-08-05: 1
  Filled 2016-08-05: qty 1

## 2016-08-05 MED ORDER — GLIPIZIDE 5 MG PO TABS: 5.0000 mg | ORAL_TABLET | Freq: Two times a day (BID) | ORAL | 3 refills | Status: DC

## 2016-08-05 MED ORDER — INSULIN ASPART 100 UNIT/ML ~~LOC~~ SOLN: 10.0000 [IU] | Freq: Three times a day (TID) | SUBCUTANEOUS | 11 refills | Status: AC

## 2016-08-05 MED ORDER — INSULIN ASPART 100 UNIT/ML ~~LOC~~ SOLN
5.0000 [IU] | Freq: Three times a day (TID) | SUBCUTANEOUS | Status: DC
  Administered 2016-08-05 (×2): 5 [IU] via SUBCUTANEOUS

## 2016-08-05 MED ORDER — INSULIN STARTER KIT- PEN NEEDLES (ENGLISH)
1.0000 | Freq: Once | Status: AC
  Administered 2016-08-05: 1
  Filled 2016-08-05: qty 1

## 2016-08-05 MED ORDER — INSULIN ASPART 100 UNIT/ML ~~LOC~~ SOLN
0.0000 [IU] | Freq: Three times a day (TID) | SUBCUTANEOUS | Status: DC
  Administered 2016-08-05: 20 [IU] via SUBCUTANEOUS
  Administered 2016-08-05: 11 [IU] via SUBCUTANEOUS

## 2016-08-05 NOTE — Progress Notes (Addendum)
Spoke to MD about pts BS of 411 she ordered  5 units of novolg sliding scale insulin given will continue to monitor

## 2016-08-05 NOTE — Discharge Instructions (Signed)
Diabetes Mellitus and Standards of Medical Care Managing diabetes (diabetes mellitus) can be complicated. Your diabetes treatment may be managed by a team of health care providers, including:  A diet and nutrition specialist (registered dietitian).  A nurse.  A certified diabetes educator (CDE).  A diabetes specialist (endocrinologist).  An eye doctor.  A primary care provider.  A dentist. Your health care providers follow a schedule in order to help you get the best quality of care. The following schedule is a general guideline for your diabetes management plan. Your health care providers may also give you more specific instructions. HbA1c ( hemoglobin A1c) test This test provides information about blood sugar (glucose) control over the previous 2-3 months. It is used to check whether your diabetes management plan needs to be adjusted.  If you are meeting your treatment goals, this test is done at least 2 times a year.  If you are not meeting treatment goals or if your treatment goals have changed, this test is done 4 times a year. Blood pressure test  This test is done at every routine medical visit. For most people, the goal is less than 140/90. In some cases, your goal blood pressure may be 130/80 or less. Ask your health care provider what your goal blood pressure should be. Dental and eye exams  Visit your dentist two times a year.  If you have type 1 diabetes, get an eye exam 3-5 years after you are diagnosed, and then once a year after your first exam.  If you were diagnosed with type 1 diabetes as a child, get an eye exam when you are age 39 or older and have had diabetes for 3-5 years. After the first exam, you should get an eye exam once a year.  If you have type 2 diabetes, have an eye exam as soon as you are diagnosed, and then once a year after your first exam. Foot care exam  Visual foot exams are done at every routine medical visit. The exams check for cuts,  bruises, redness, blisters, sores, or other problems with the feet.  A complete foot exam is done by your health care provider once a year. This exam includes an inspection of the structure and skin of your feet, and a check of the pulses and sensation in your feet.  Type 1 diabetes: Get your first exam 3-5 years after diagnosis.  Type 2 diabetes: Get your first exam as soon as you are diagnosed.  Check your feet every day for cuts, bruises, redness, blisters, or sores. If you have any of these or other problems that are not healing, contact your health care provider. Kidney function test ( urine microalbumin)  This test is done once a year.  Type 1 diabetes: Get your first test 5 years after diagnosis.  Type 2 diabetes: Get your first test as soon as you are diagnosed.  If you have chronic kidney disease (CKD), get a serum creatinine and estimated glomerular filtration rate (eGFR) test once a year. Lipid profile (cholesterol, HDL, LDL, triglycerides)  This test should be done when you are diagnosed with diabetes, and every 5 years after the first test. If you are on medicines to lower your cholesterol, you may need to get this test done every year.  The goal for LDL is less than 100 mg/dL (5.5 mmol/L). If you are at high risk, the goal is less than 70 mg/dL (3.9 mmol/L).  The goal for HDL is 40 mg/dL (2.2 mmol/L)  for men and 50 mg/dL(2.8 mmol/L) for women. An HDL cholesterol of 60 mg/dL (3.3 mmol/L) or higher gives some protection against heart disease.  The goal for triglycerides is less than 150 mg/dL (8.3 mmol/L). Immunizations  The yearly flu (influenza) vaccine is recommended for everyone 6 months or older who has diabetes.  The pneumonia (pneumococcal) vaccine is recommended for everyone 2 years or older who has diabetes. If you are 51 or older, you may get the pneumonia vaccine as a series of two separate shots.  The hepatitis B vaccine is recommended for adults shortly  after they have been diagnosed with diabetes.  The Tdap (tetanus, diphtheria, and pertussis) vaccine should be given:  According to normal childhood vaccination schedules, for children.  Every 10 years, for adults who have diabetes.  The shingles vaccine is recommended for people who have had chicken pox and are 50 years or older. Mental and emotional health  Screening for symptoms of eating disorders, anxiety, and depression is recommended at the time of diagnosis and afterward as needed. If your screening shows that you have symptoms (you have a positive screening result), you may need further evaluation and be referred to a mental health care provider. Diabetes self-management education  Education about how to manage your diabetes is recommended at diagnosis and ongoing as needed. Treatment plan  Your treatment plan will be reviewed at every medical visit. Summary  Managing diabetes (diabetes mellitus) can be complicated. Your diabetes treatment may be managed by a team of health care providers.  Your health care providers follow a schedule in order to help you get the best quality of care.  Standards of care including having regular physical exams, blood tests, blood pressure monitoring, immunizations, screening tests, and education about how to manage your diabetes.  Your health care providers may also give you more specific instructions based on your individual health. This information is not intended to replace advice given to you by your health care provider. Make sure you discuss any questions you have with your health care provider. Document Released: 06/09/2009 Document Revised: 05/10/2016 Document Reviewed: 05/10/2016 Elsevier Interactive Patient Education  2017 [employer].   Type 2 Diabetes Mellitus, Diagnosis, Adult Type 2 diabetes (type 2 diabetes mellitus) is a long-term (chronic) disease. It may be caused by one or both of these problems:  Your body does not  make enough of a hormone called insulin.  Your body does not react in a normal way to insulin that it makes. Insulin lets sugars (glucose) go into cells in the body. This gives you energy. If you have type 2 diabetes, sugars cannot get into cells. This causes high blood sugar (hyperglycemia). Your doctor will set treatment goals for you. Generally, you should have these blood sugar levels:  Before meals (preprandial): 80-130 mg/dL (4.4-7.2 mmol/L).  After meals (postprandial): below 180 mg/dL (10 mmol/L).  A1c (hemoglobin A1c) level: less than 7%. Follow these instructions at home: Questions to Ask Your Doctor  You may want to ask these questions:  Do I need to meet with a diabetes educator?  Where can I find a support group for people with diabetes?  What equipment will I need to care for myself at home?  What diabetes medicines do I need? When should I take them?  How often do I need to check my blood sugar?  What number can I call if I have questions?  When is my next doctor's visit? General instructions  Take over-the-counter and prescription medicines  only as told by your doctor.  Keep all follow-up visits as told by your doctor. This is important. Contact a doctor if:  Your blood sugar is at or above 240 mg/dL (13.3 mmol/L) for 2 days in a row.  You have been sick or have had a fever for 2 days or more and you are not getting better.  You have any of these problems for more than 6 hours:  You cannot eat or drink.  You feel sick to your stomach (nauseous).  You throw up (vomit).  You have watery poop (diarrhea). Get help right away if:  Your blood sugar is lower than 54 mg/dL (3 mmol/L).  You get confused.  You have trouble:  Thinking clearly.  Breathing.  You have moderate or large ketone levels in your pee (urine). This information is not intended to replace advice given to you by your health care provider. Make sure you discuss any questions you  have with your health care provider. Document Released: 05/21/2008 Document Revised: 01/18/2016 Document Reviewed: 09/15/2015 Elsevier Interactive Patient Education  2017 [employer].

## 2016-08-05 NOTE — Discharge Summary (Signed)
Name: Paul CanalesGregory A Avila MRN: [id number] DOB: 11/02/1960 55 y.o. PCP: No primary care provider on file.  Date of Admission: 08/03/2016 10:03 AM Date of Discharge: 08/05/2016 Attending Physician: Gust RungErik C Hoffman, DO  Discharge Diagnosis: Principal Problem:   Hyperglycemia Active Problems:   CAD (coronary artery disease)   HTN (hypertension)   New onset type 2 diabetes mellitus (HCC)   Discharge Medications:   Medication List    TAKE these medications   aspirin EC 81 MG tablet Take 81 mg by mouth daily.   carvedilol 6.25 MG tablet Commonly known as:  COREG Take 6.25 mg by mouth daily at 12 noon.   furosemide 40 MG tablet Commonly known as:  LASIX Take 40 mg by mouth daily.   insulin aspart 100 UNIT/ML injection Commonly known as:  novoLOG Inject 10 Units into the skin 3 (three) times daily with meals.   insulin glargine 100 UNIT/ML injection Commonly known as:  LANTUS Inject 0.3 mLs (30 Units total) into the skin daily. Start taking on:  08/06/2016   losartan 25 MG tablet Commonly known as:  COZAAR Take 25 mg by mouth daily at 12 noon.   multivitamin with minerals Tabs tablet Take 1 tablet by mouth daily.   rosuvastatin 40 MG tablet Commonly known as:  CRESTOR Take 40 mg by mouth daily at 12 noon.   Syringe/Needle (Disp) 30G X 1/2" 1 ML Misc Use to inject insulin under the skin as directed.   ticagrelor 90 MG Tabs tablet Commonly known as:  BRILINTA Take 90 mg by mouth 2 (two) times daily.       Disposition and follow-up:   Paul Avila [facility] Good condition.  At the hospital follow up visit please address:  1.  DM: Patient was started on every before meals and daily at bedtime insulin. Please readdress his blood sugars on this regimen and consider titrating his insulin. Patient reports that he is agreeable to aggressive life some modification in order to discontinue insulin in the future.  2.  Labs /  imaging needed at time of follow-up: CBG  Follow-up Appointments: Follow-up Information    [facility]. Go in 1 day(s).   Why:  please call to make a follow up apt in 2 weeks. Contact information: [address] [place] [phone]          Hospital Course by problem list: Principal Problem:   Hyperglycemia Active Problems:   CAD (coronary artery disease)   HTN (hypertension)   New onset type 2 diabetes mellitus (HCC)   1. DM / Hyperglycemia: Patient who [place] presented to the emergency department with complaints of polydipsia, polyuria, dry mouth which has started occurring 2 weeks ago. He reports that these symptoms have gotten worse and he began to drink large quantities of juice and soda. In the emergency department he was found to have elevated blood glucose greater than 1000. His A1c was 15.5 suggesting chronically elevated blood glucose. He was not acidotic, had no changes in mental status, and had small amount of ketones in the urine. He was started on insulin drip and wean to subcutaneous insulin. His blood glucose initially resolved and then was found to be elevated again on hospital day one. His symptoms continued his insulin was titrated up. He was discharged with nightly Lantus insulin of 30 units, mealtime NovoLog insulin of 10 units and [facility] [place] he remains in [place]. Patient reports that he has primary care provider in [place] and reports he will follow-up with his PCP when he returns home in January.  Pt was inadvertently discharged with glipizide prescriptions. These prescriptions were cancelled and we attempted to contact the patient but the number provided was incorrect.  2. CAD / Ischemic Cardiomyopathy: Patient has history of coronary artery disease status post stenting and AICD placement for  ischemic cardio myopathy. Patient is a poor medical historian and review his records indicate that he has a history of heart failure exacerbation with fluid overload. On her exams he is clinically euvolemic and has clear lungs with no signs of vascular congestion on chest x-ray. His home medications were continued and he was watched carefully given his need for aggressive volume resuscitation in the setting of his hyperglycemia and free water deficit. His heart tolerated the IVF well and he had no palpitations. He was discharged on his PTA medications.  Discharge Vitals:   BP 110/68 (BP Location: Right Arm)   Pulse 91   Temp 97.4 F (36.3 C) (Oral)   Resp 18   Ht 5\' 9"  (1.753 m)   Wt 245 lb (111.1 kg)   SpO2 100%   BMI 36.18 kg/m   Pertinent Labs, Studies, and Procedures: As above.  Procedures Performed:  Dg Chest 2 View  Result Date: 08/03/2016 CLINICAL DATA:  Cough. EXAM: CHEST  2 VIEW COMPARISON:  None. 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 No pneumothorax. The heart, hila, mediastinum, lungs, and pleura are normal. Degenerative changes in the thoracic spine. No other abnormalities. IMPRESSION: No active cardiopulmonary disease. Electronically Signed   By: Gerome Samavid  Williams III M.D   On: 08/03/2016 10:54   Discharge Instructions: Discharge Instructions    Diet - low sodium heart healthy    Complete by:  As directed    Discharge instructions    Complete by:  As directed    We have treated you for hyperglycemia.  You have diabetes and will need treatment. We will need to start you on insulin in order to bring your blood sugar down and get your diabetes under control. Please take these medications regularly. There is a short acting insulin called Novolog which you will inject 10U under the skin before every meal. There is also a long acting insulin which you will inject 30U under the skin at night before bed. Please continue to take your other medications as you were  before coming to the hospital.  It is imperative that you make an appointment with [facility] here in [place] on Tuesday or Wednesday to be seen by a doctor. [facility] is a walk-in clinic here in [place]. Please go to this clinic by Tuesday or Wednesday for a follow-up of your hospitalization.   Increase activity slowly    Complete by:  As directed       Signed: Carolynn CommentBryan Montanna Mcbain, MD 08/05/2016, 5:29 PM   Pager: [phone]

## 2016-08-05 NOTE — Care Management Note (Addendum)
Case Management Note  Patient Details  Name: Paul Avila MRN: [id number] Date of Birth: 1960-09-05  Subjective/Objective:    Patient presents with hyperglycemia, pta indep, he is from [place]. He has [place] out of [employer] [place],  NCM is still waiting on the benefit check.  They will also check to see if patient would be able to get a 30 day supply from a pharmacy here, ( per DM coordinator , sometimes medicaid out of state will so that)  If not [facility] has lantus and novolog in the vials not in the pens, NCM will assist patient with the Match Program  And then when he gets back to [place] he can use his Medicaid out of state insurance.  Lantus is not covered by his insurance, and Levemir is not covered by his insurance.  NCM may have to ast with Match Letter.  Also NCM gave patient reli-on information for glucometer for 16.00 and strips for $9.00 at [facility]. [facility] states syringes for 100 are 13.00.  Patient states when he goes back to [place] he will be going to see his pcp and he plans to go back after New Year.  Patient was given the [facility] brochure and told to call to make hospital follow up apt in 2 weeks.  He said he understands.               Action/Plan:   Expected Discharge Date:                  Expected Discharge Plan:  Home/Self Care  In-House Referral:     Discharge planning Services  CM Consult, Medication Assistance, MATCH Program  Post Acute Care Choice:    Choice offered to:     DME Arranged:    DME Agency:     HH Arranged:    HH Agency:     Status of Service:  Completed, signed off  If discussed at [employer] Length of Stay Meetings, dates discussed:    Additional Comments:  Leone Havenaylor, Nura Cahoon Clinton, RN 08/05/2016, 11:47 AM

## 2016-08-05 NOTE — Progress Notes (Signed)
Explained and discussed discharged instructions given. Follow up appt. Prescriptions and 12 insulin syringes given per deborah case Production designer, theatre/television/filmmanager. Pt demonstrated by self administering 21 units of novolog insulin subq.

## 2016-08-05 NOTE — Progress Notes (Signed)
   Subjective:  Currently the pt is feeling much better. He is ready for discharge and anxious to leave the hospital. He is agreeable to taking oral hypoglycemics and insulin in order to control his BG. He has gotten counseling on diet and insulin administration. We will [facility] while in [place] and he will return to the care of his PCP when he goes back to [place].  Objective:  Vital signs in last 24 hours: Vitals:   08/04/16 1425 08/04/16 1907 08/04/16 2329 08/05/16 0506  BP: (!) 222/85 106/90 104/76 118/79  Pulse: 82 83 77 80  Resp: (!) 21 (!) 28 19 17   Temp: 98.1 F (36.7 C) 98 F (36.7 C) 98.3 F (36.8 C) 97.5 F (36.4 C)  TempSrc: Oral Oral Oral Oral  SpO2: 100% 98% 100% 100%  Weight:      Height:       Physical Exam  Constitutional: He is oriented to person, place, and time. He appears well-developed and well-nourished.  HENT:  Head: Normocephalic and atraumatic.  Cardiovascular: Normal rate and regular rhythm.  Exam reveals no friction rub.   No murmur heard. Respiratory: Effort normal and breath sounds normal. No respiratory distress. He has no wheezes.  GI: Soft. Bowel sounds are normal. He exhibits no distension. There is no tenderness.  Musculoskeletal: He exhibits no edema.  Neurological: He is alert and oriented to person, place, and time.   Assessment/Plan:  Principal Problem:   Hyperglycemia Active Problems:   CAD (coronary artery disease)   HTN (hypertension)  1) Hyperglycemia: Reports no h/o DM, but records demonstrate h/o metformin and januvia (now listed as allergies). BG >1000 w/ h/o polyuria/polydipsia. No changes to MS, non-acidotic, mild ketonuria, AG 17. Unlikely DKA, not HHS d/t stable MS. Pt agreeable to home hypoglycemics and to insulin in order to control his BG. Will establish f/u visit short term w/ [facility], where he will be able to get small supply of insulin until he returns to [place]. - Lantus 30U qHS + Novalog 10U qAC -  glipizide 5mg  BID - SSI-r  2) CAD / ischemic cardiomyopathy: denies CP. AICD visualized on CXR. Non-ischemic EKG. Mild trop bump in ED. On furosemide, losartan, carvedilol, rosuvastatin, ticagrelor at home. Continue home meds.  3) HTN: BP stable on presentation. Continue home meds as above.  4) AKI: Resolved.  DVT PPx - low molecular weight heparin  Code Status - FULL  Dispo: Anticipated discharge today.  Carolynn CommentBryan Damon Hargrove, MD 08/05/2016, 7:50 AM Pager: [phone]

## 2016-08-05 NOTE — Progress Notes (Signed)
Nutrition Education Note  RD consulted for nutrition education regarding diabetes.   Lab Results  Component Value Date   HGBA1C >15.5 (H) 08/03/2016    RD provided "Carbohydrate Counting for People with Diabetes" handout from the Academy of Nutrition and Dietetics.   Patient did not want RD to review the information with him, stating, "I already have this." He said that he has many family members with diabetes and he was already following a diet at home, but he was having fun with his kids and started having too much juice. Patient was not interested in learning more about his diet for diabetes.  Expect poor compliance.   Body mass index is 36.18 kg/m. Pt meets criteria for class 2 obesity based on current BMI.  Current diet order is heart healthy, CHO modified, patient is consuming approximately 100% of meals at this time. Labs and medications reviewed. No further nutrition interventions warranted at this time. RD contact information provided. If additional nutrition issues arise, please re-consult RD.  Joaquin CourtsKimberly Harris, RD, LDN, CNSC Pager [phone] After Hours Pager [phone]

## 2016-08-05 NOTE — Progress Notes (Addendum)
Inpatient Diabetes Program Recommendations  AACE/ADA: New Consensus Statement on Inpatient Glycemic Control (2015)  Target Ranges:  Prepandial:   less than 140 mg/dL      Peak postprandial:   less than 180 mg/dL (1-2 hours)      Critically ill patients:  140 - 180 mg/dL   Results for Paul Avila, AGRUSA (MRN [id number]) as of 08/05/2016 09:15  Ref. Range 08/04/2016 08:08 08/04/2016 11:35 08/04/2016 17:17 08/04/2016 21:24 08/05/2016 07:48  Glucose-Capillary Latest Ref Range: 65 - 99 mg/dL 409 (H) 476 (H) 351 (H) 411 (H) 413 (H)   Review of Glycemic Control  Diabetes history: Denies DM hx; (per H&P has been on Metformin and Januvia in the past) Outpatient Diabetes medications: None Current orders for Inpatient glycemic control: Lantus 30 units daily, Novolog 5 units TID with meals, Novolog 0-15 units TID with meals, Novolog 0-5 units QHS  Inpatient Diabetes Program Recommendations: Insulin - Basal: Please consider increasing Lantus to 44 units daily (starting now; based on 111 kg x 0.4 units). Correction (SSI): Please consider increasing Novolog correction to Resistant Scale. Insulin - Meal Coverage: Please consider increasing meal coverage to Novolog 10 units TID with meals. HgbA1C: A1C 15.5% on 08/03/16 indicating an average glucose of 398 mg/dl over the past 2-3 months.  Note: Ordered: Living Well with DM booklet, RD consult for diet education, insulin starter kit, patient education videos, and DM education by bedside RNs. NURSING: Please use each patient interaction to provide diabetes education. Please review Living Well with Diabetes booklet with the patient, have patient watch patient education videos on diabetes, and instruct on insulin administration. Please allow patient to be actively engaged with diabetes management by allowing patient to check own glucose and self-administer insulin injections. Diabetes Coordinator will follow up with patient and reinforce diabetes  education.  Addendum 08/05/16@12 :30-Spoke with patient about diabetes and home regimen for diabetes control. Patient reports that he is from [place] and here in [place] visiting family. Patient states that he use to be on oral medications for diabetes after his heart attack about 1 year ago. Patient reports that over the past year he was taken off the medications and told he no longer had diabetes.  Patient reports that he is followed by PCP in [place] and plans to return to [place] in January but plans to go to [facility] while here in [place]. Patient states that he got a glucometer when he was started on DM medications but he is not sure where it is. Discussed A1C results (15.5% on 08/03/16) and explained that his current A1C indicates an average glucose of 398 mg/dl over the past 2-3 months. Discussed glucose and A1C goals. Reviewed Living Well with Diabetes book and encouraged patient to read through entire book. Patient has already began watching DM videos and has self-administered insulin to himself. Patient states that he is agreeable to taking insulin if needed because he wants to take care of himself.  Discussed Lantus and Novolog insulin. Discussed insulin administration with vial/syringe and insulin pens. Patient is agreeable to using either method of insulin administration and states that he will use vial/syringe.  Reviewed and demonstrated how to draw up and administer insulin with vial and syringe. Patient was able to successfully demonstrate how to draw up and administer insulin with vial and syringe. Discussed importance of checking CBGs and maintaining good CBG control to prevent long-term and short-term complications. Discussed hyperglycemia and hypoglycemia along with treatment for both. Explained how hyperglycemia leads  to damage within blood vessels which lead to the common complications seen with uncontrolled diabetes. Stressed to the patient the importance of taking medications  as prescribed and following up with PCP.  Discussed impact of nutrition, exercise, stress, sickness, and medications on diabetes control. Patient states that he has not been following any type of diet and admits to drinking lots of juice, eating candy and other sweets since he has been in [place].  Discussed carbohydrates, carbohydrate goals per day and meal, along with portion sizes. Encouraged patient to check his glucose 4 times per day (before meals and at bedtime) and to keep a log book of glucose readings and insulin taken which he will need to take to doctor appointments. Explained how the doctor he follows up with can use the log book to continue to make insulin adjustments if needed. Patient verbalized understanding of information discussed and he states that he has no further questions at this time related to diabetes.  MD: At time of discharge, please provide Rx for: Glucometer, testing supplies, insulin vials (Lantus and Novolog), and insulin syringes.  Thanks, Barnie Alderman, RN, MSN, CDE Diabetes Coordinator Inpatient Diabetes Program [phone] (Team Pager from 8am to 5pm)

## 2016-08-05 NOTE — Progress Notes (Signed)
Started patient watching the diabetes education videos. Patient states "I already knows all of this. Wont be doing this at home." I continued to educate patient but expect poor compliance. Will continue to monitor.

## 2016-08-06 ENCOUNTER — Telehealth: Payer: Self-pay | Admitting: Internal Medicine

## 2016-08-06 DIAGNOSIS — E119 Type 2 diabetes mellitus without complications: Secondary | ICD-10-CM

## 2016-08-06 MED ORDER — "SYRINGE/NEEDLE (DISP) 30G X 1/2"" 1 ML MISC": 3 refills | Status: DC

## 2016-08-06 MED ORDER — INSULIN GLARGINE 100 UNIT/ML ~~LOC~~ SOLN: 30.0000 [IU] | Freq: Every day | SUBCUTANEOUS | 11 refills | Status: DC

## 2016-08-06 NOTE — Telephone Encounter (Signed)
Called by nurse that patient was not able to pick up his Lantus and syringes at [facility] because no prescription there. I resent the prescriptions to [facility] on [address].

## 2016-08-06 NOTE — Progress Notes (Signed)
Patient reports being unable to find Rx's that were given to him yesterday. Patient's pharmacy called and reported they had only on of the Rx's and that they can do the match letter. MD was paged and new order will be faxed over to that pharmacy.

## 2016-08-07 ENCOUNTER — Other Ambulatory Visit: Payer: Self-pay | Admitting: Internal Medicine

## 2016-08-07 MED ORDER — "SYRINGE/NEEDLE (DISP) 30G X 1/2"" 1 ML MISC": 3 refills | Status: AC

## 2016-08-07 MED ORDER — INSULIN GLARGINE 100 UNIT/ML ~~LOC~~ SOLN: 30.0000 [IU] | Freq: Every day | SUBCUTANEOUS | 11 refills | Status: AC

## 2016-08-07 NOTE — Progress Notes (Signed)
[facility] called and stated they do not have script for lantus, RN who did discharge stated there were no scripts,  NCM contacted MD , he will call script into [facility] for lantus.

## 2016-08-09 ENCOUNTER — Inpatient Hospital Stay: Payer: Medicaid - Out of State

## 2017-01-24 DEATH — deceased

## 2017-11-14 IMAGING — DX DG CHEST 2V
2 series · 2 of 2 positions shown · non-contrast
Comparison: None.

CLINICAL DATA: Cough.

EXAM:
CHEST  2 VIEW

[chest pa]
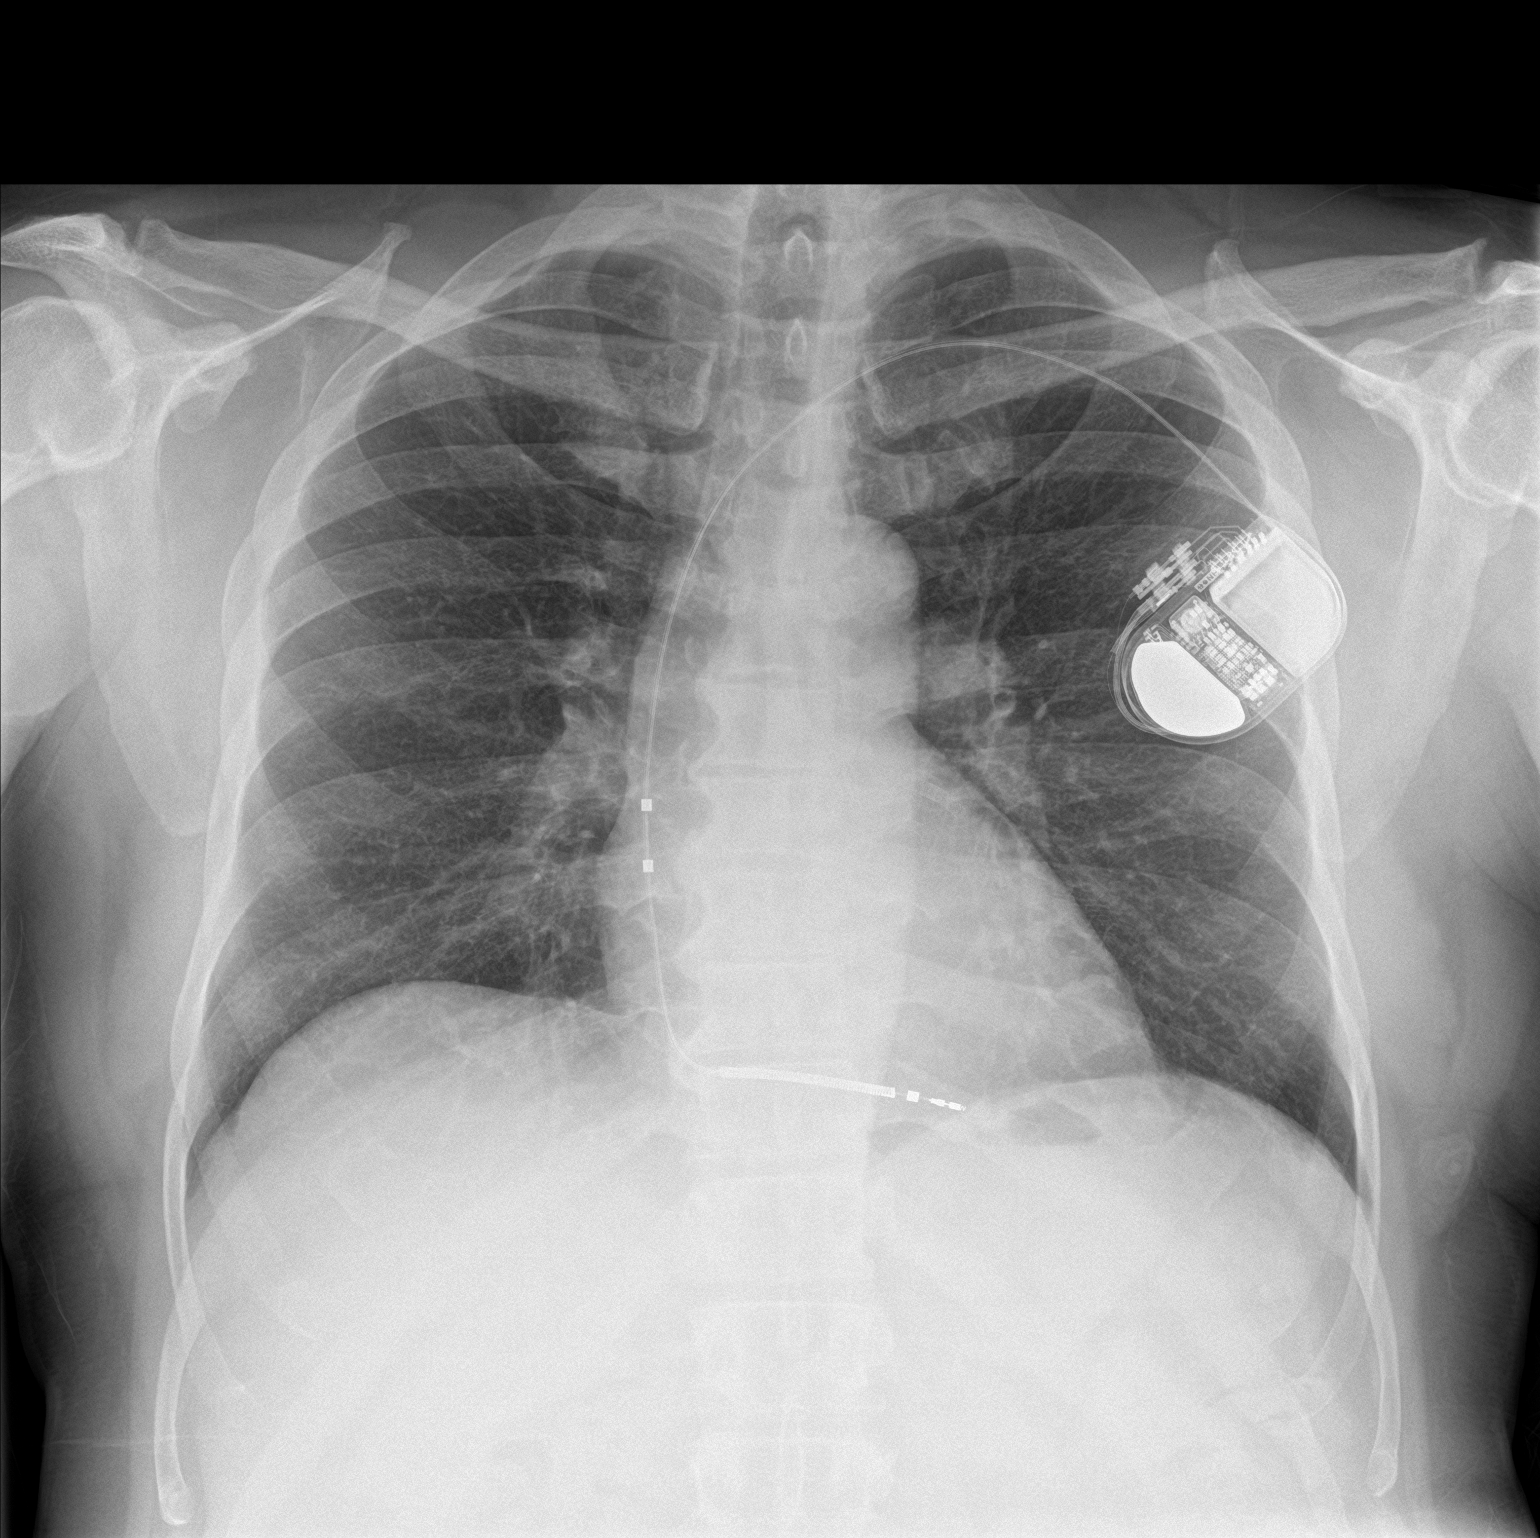

[chest lat]
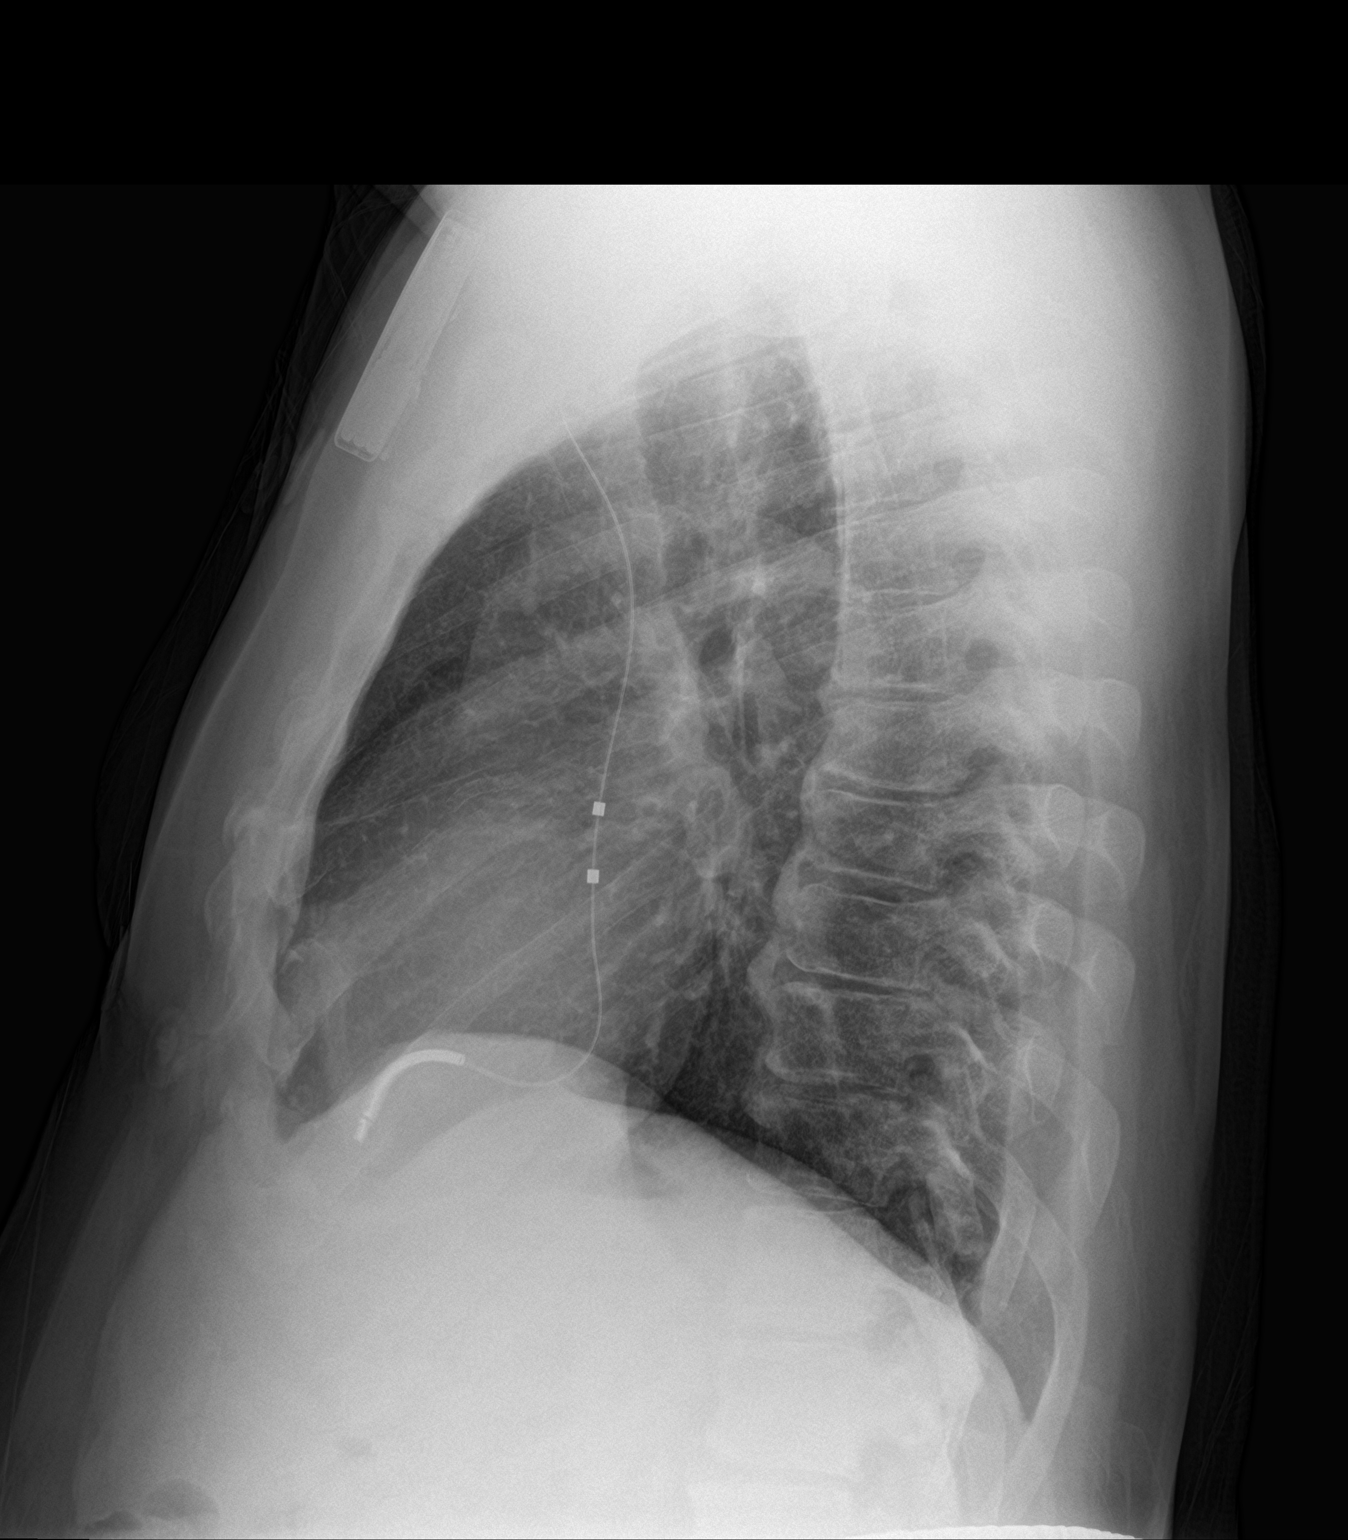

[2 of 2 positions shown; findings below may reference images not displayed]

FINDINGS: A single lead AICD device is in good position. No pneumothorax. The
heart, hila, mediastinum, lungs, and pleura are normal. Degenerative
changes in the thoracic spine. No other abnormalities.
IMPRESSION: No active cardiopulmonary disease.
